# Patient Record
Sex: Male | Born: 1990 | Hispanic: No | Marital: Single | Smoking: Never smoker
Health system: Southern US, Community
[De-identification: ages and names within clinical notes are randomized; demographics above are authoritative.]

## PROBLEM LIST (undated history)

## (undated) HISTORY — PX: TONSILLECTOMY: SUR1361

---
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
## 2013-01-27 ENCOUNTER — Encounter (HOSPITAL_COMMUNITY): Payer: Self-pay | Admitting: Emergency Medicine

## 2013-01-27 ENCOUNTER — Emergency Department (INDEPENDENT_AMBULATORY_CARE_PROVIDER_SITE_OTHER)
Admission: EM | Admit: 2013-01-27 | Discharge: 2013-01-27 | Disposition: A | Discharge status: Home / Self Care | Payer: BC Managed Care – PPO | Source: Home / Self Care | Attending: Family Medicine | Admitting: Family Medicine

## 2013-01-27 DIAGNOSIS — L237 Allergic contact dermatitis due to plants, except food: Secondary | ICD-10-CM

## 2013-01-27 DIAGNOSIS — L255 Unspecified contact dermatitis due to plants, except food: Secondary | ICD-10-CM

## 2013-01-27 MED ORDER — METHYLPREDNISOLONE ACETATE 80 MG/ML IJ SUSP
INTRAMUSCULAR | Status: AC
  Filled 2013-01-27: qty 1

## 2013-01-27 MED ORDER — METHYLPREDNISOLONE ACETATE 40 MG/ML IJ SUSP
80.0000 mg | Freq: Once | INTRAMUSCULAR | Status: AC
  Administered 2013-01-27: 80 mg via INTRAMUSCULAR

## 2013-01-27 MED ORDER — FLUTICASONE PROPIONATE 0.05 % EX CREA: TOPICAL_CREAM | Freq: Two times a day (BID) | CUTANEOUS | Status: DC

## 2013-01-27 MED ORDER — TRIAMCINOLONE ACETONIDE 40 MG/ML IJ SUSP
40.0000 mg | Freq: Once | INTRAMUSCULAR | Status: AC
  Administered 2013-01-27: 40 mg via INTRAMUSCULAR

## 2013-01-27 MED ORDER — TRIAMCINOLONE ACETONIDE 40 MG/ML IJ SUSP
INTRAMUSCULAR | Status: AC
  Filled 2013-01-27: qty 5

## 2013-01-27 NOTE — ED Notes (Signed)
Pt c/o poss poison ivy onset Monday on right arm Denies: f/v/n/d... He is alert and oriented w/no signs of acute distress.

## 2013-01-27 NOTE — ED Provider Notes (Addendum)
History     CSN: [id number]  Arrival date & time 01/27/13  1412   None     Chief Complaint  Patient presents with  . Poison Ivy    (Consider location/radiation/quality/duration/timing/severity/associated sxs/prior treatment) Patient is a 22 y.o. male presenting with rash. The history is provided by the patient.  Rash Pain severity:  Mild Onset quality:  Gradual Duration:  6 days Progression:  Worsening Chronicity:  New Context comment:  Onset after poison ivy contact with dog.   History reviewed. No pertinent past medical history.  History reviewed. No pertinent past surgical history.  No family history on file.  History  Substance Use Topics  . Smoking status: Never Smoker   . Smokeless tobacco: Not on file  . Alcohol Use: Yes      Review of Systems  Constitutional: Negative.   Skin: Positive for rash.    Allergies  Review of patient's allergies indicates no known allergies.  Home Medications   Current Outpatient Rx  Name  Route  Sig  Dispense  Refill  . fluticasone (CUTIVATE) 0.05 % cream   Topical   Apply topically 2 (two) times daily.   60 g   0     BP 123/75  Pulse 71  Temp(Src) 97.9 F (36.6 C) (Oral)  Resp 17  SpO2 96%  Physical Exam  Nursing note and vitals reviewed. Constitutional: He is oriented to person, place, and time. He appears well-developed and well-nourished.  Neurological: He is alert and oriented to person, place, and time.  Skin: Skin is warm and dry. Rash noted.  Papulovesicular patchy rash primarily on upper ext.    ED Course  Procedures (including critical care time)  Labs Reviewed - No data to display No results found.   1. Contact dermatitis due to poison ivy       MDM          Linna Hoff, MD 01/27/13 1515  Linna Hoff, MD 01/28/13 [phone]

## 2013-10-02 ENCOUNTER — Encounter (HOSPITAL_COMMUNITY): Payer: Self-pay | Admitting: Emergency Medicine

## 2013-10-02 ENCOUNTER — Emergency Department (INDEPENDENT_AMBULATORY_CARE_PROVIDER_SITE_OTHER)
Admission: EM | Admit: 2013-10-02 | Discharge: 2013-10-02 | Disposition: A | Discharge status: Home / Self Care | Payer: BC Managed Care – PPO | Source: Home / Self Care

## 2013-10-02 DIAGNOSIS — J029 Acute pharyngitis, unspecified: Secondary | ICD-10-CM

## 2013-10-02 DIAGNOSIS — J069 Acute upper respiratory infection, unspecified: Secondary | ICD-10-CM

## 2013-10-02 DIAGNOSIS — R0982 Postnasal drip: Secondary | ICD-10-CM

## 2013-10-02 LAB — POCT RAPID STREP A: Streptococcus, Group A Screen (Direct): NEGATIVE

## 2013-10-02 MED ORDER — ALBUTEROL SULFATE HFA 108 (90 BASE) MCG/ACT IN AERS: 1.0000 | INHALATION_SPRAY | Freq: Four times a day (QID) | RESPIRATORY_TRACT | Status: DC | PRN

## 2013-10-02 MED ORDER — GUAIFENESIN-CODEINE 100-10 MG/5ML PO SYRP: 5.0000 mL | ORAL_SOLUTION | ORAL | Status: DC | PRN

## 2013-10-02 NOTE — Discharge Instructions (Signed)
Cough, Adult  A cough is a reflex that helps clear your throat and airways. It can help heal the body or may be a reaction to an irritated airway. A cough may only last 2 or 3 weeks (acute) or may last more than 8 weeks (chronic).  CAUSES Acute cough:  Viral or bacterial infections. Chronic cough:  Infections.  Allergies.  Asthma.  Post-nasal drip.  Smoking.  Heartburn or acid reflux.  Some medicines.  Chronic lung problems (COPD).  Cancer. SYMPTOMS   Cough.  Fever.  Chest pain.  Increased breathing rate.  High-pitched whistling sound when breathing (wheezing).  Colored mucus that you cough up (sputum). TREATMENT   A bacterial cough may be treated with antibiotic medicine.  A viral cough must run its course and will not respond to antibiotics.  Your caregiver may recommend other treatments if you have a chronic cough. HOME CARE INSTRUCTIONS   Only take over-the-counter or prescription medicines for pain, discomfort, or fever as directed by your caregiver. Use cough suppressants only as directed by your caregiver.  Use a cold steam vaporizer or humidifier in your bedroom or home to help loosen secretions.  Sleep in a semi-upright position if your cough is worse at night.  Rest as needed.  Stop smoking if you smoke. SEEK IMMEDIATE MEDICAL CARE IF:   You have pus in your sputum.  Your cough starts to worsen.  You cannot control your cough with suppressants and are losing sleep.  You begin coughing up blood.  You have difficulty breathing.  You develop pain which is getting worse or is uncontrolled with medicine.  You have a fever. MAKE SURE YOU:   Understand these instructions.  Will watch your condition.  Will get help right away if you are not doing well or get worse. Document Released: 02/12/2011 Document Revised: 11/08/2011 Document Reviewed: 02/12/2011 [facility] Patient Information 2014 [place].  Pharyngitis Pharyngitis is  redness, pain, and swelling (inflammation) of your pharynx.  CAUSES  Pharyngitis is usually caused by infection. Most of the time, these infections are from viruses (viral) and are part of a cold. However, sometimes pharyngitis is caused by bacteria (bacterial). Pharyngitis can also be caused by allergies. Viral pharyngitis may be spread from person to person by coughing, sneezing, and personal items or utensils (cups, forks, spoons, toothbrushes). Bacterial pharyngitis may be spread from person to person by more intimate contact, such as kissing.  SIGNS AND SYMPTOMS  Symptoms of pharyngitis include:   Sore throat.   Tiredness (fatigue).   Low-grade fever.   Headache.  Joint pain and muscle aches.  Skin rashes.  Swollen lymph nodes.  Plaque-like film on throat or tonsils (often seen with bacterial pharyngitis). DIAGNOSIS  Your health care provider will ask you questions about your illness and your symptoms. Your medical history, along with a physical exam, is often all that is needed to diagnose pharyngitis. Sometimes, a rapid strep test is done. Other lab tests may also be done, depending on the suspected cause.  TREATMENT  Viral pharyngitis will usually get better in 3 4 days without the use of medicine. Bacterial pharyngitis is treated with medicines that kill germs (antibiotics).  HOME CARE INSTRUCTIONS   Drink enough water and fluids to keep your urine clear or pale yellow.   Only take over-the-counter or prescription medicines as directed by your health care provider:   If you are prescribed antibiotics, make sure you finish them even if you start to feel better.   Do  not take aspirin.   Get lots of rest.   Gargle with 8 oz of salt water ( tsp of salt per 1 qt of water) as often as every 1 2 hours to soothe your throat.   Throat lozenges (if you are not at risk for choking) or sprays may be used to soothe your throat. SEEK MEDICAL CARE IF:   You have large,  tender lumps in your neck.  You have a rash.  You cough up green, yellow-brown, or bloody spit. SEEK IMMEDIATE MEDICAL CARE IF:   Your neck becomes stiff.  You drool or are unable to swallow liquids.  You vomit or are unable to keep medicines or liquids down.  You have severe pain that does not go away with the use of recommended medicines.  You have trouble breathing (not caused by a stuffy nose). MAKE SURE YOU:   Understand these instructions.  Will watch your condition.  Will get help right away if you are not doing well or get worse. Document Released: 08/16/2005 Document Revised: 06/06/2013 Document Reviewed: 04/23/2013 [facility] Patient Information 2014 [place], [place].  Sore Throat A sore throat is pain, burning, irritation, or scratchiness of the throat. There is often pain or tenderness when swallowing or talking. A sore throat may be accompanied by other symptoms, such as coughing, sneezing, fever, and swollen neck glands. A sore throat is often the first sign of another sickness, such as a cold, flu, strep throat, or mononucleosis (commonly known as mono). Most sore throats go away without medical treatment. CAUSES  The most common causes of a sore throat include:  A viral infection, such as a cold, flu, or mono.  A bacterial infection, such as strep throat, tonsillitis, or whooping cough.  Seasonal allergies.  Dryness in the air.  Irritants, such as smoke or pollution.  Gastroesophageal reflux disease (GERD). HOME CARE INSTRUCTIONS   Only take over-the-counter medicines as directed by your caregiver.  Drink enough fluids to keep your urine clear or pale yellow.  Rest as needed.  Try using throat sprays, lozenges, or sucking on hard candy to ease any pain (if older than 4 years or as directed).  Sip warm liquids, such as broth, herbal tea, or warm water with honey to relieve pain temporarily. You may also eat or drink cold or frozen liquids such as frozen  ice pops.  Gargle with salt water (mix 1 tsp salt with 8 oz of water).  Do not smoke and avoid secondhand smoke.  Put a cool-mist humidifier in your bedroom at night to moisten the air. You can also turn on a hot shower and sit in the bathroom with the door closed for 5 10 minutes. SEEK IMMEDIATE MEDICAL CARE IF:  You have difficulty breathing.  You are unable to swallow fluids, soft foods, or your saliva.  You have increased swelling in the throat.  Your sore throat does not get better in 7 days.  You have nausea and vomiting.  You have a fever or persistent symptoms for more than 2 3 days.  You have a fever and your symptoms suddenly get worse. MAKE SURE YOU:   Understand these instructions.  Will watch your condition.  Will get help right away if you are not doing well or get worse. Document Released: 09/23/2004 Document Revised: 08/02/2012 Document Reviewed: 04/23/2012 [facility] Patient Information 2014 [place], [place].  Upper Respiratory Infection, Adult Use alka seltzer cold plus nighttime medication An upper respiratory infection (URI) is also sometimes known as  the common cold. The upper respiratory tract includes the nose, sinuses, throat, trachea, and bronchi. Bronchi are the airways leading to the lungs. Most people improve within 1 week, but symptoms can last up to 2 weeks. A residual cough may last even longer.  CAUSES Many different viruses can infect the tissues lining the upper respiratory tract. The tissues become irritated and inflamed and often become very moist. Mucus production is also common. A cold is contagious. You can easily spread the virus to others by oral contact. This includes kissing, sharing a glass, coughing, or sneezing. Touching your mouth or nose and then touching a surface, which is then touched by another person, can also spread the virus. SYMPTOMS  Symptoms typically develop 1 to 3 days after you come in contact with a cold virus. Symptoms  vary from person to person. They may include:  Runny nose.  Sneezing.  Nasal congestion.  Sinus irritation.  Sore throat.  Loss of voice (laryngitis).  Cough.  Fatigue.  Muscle aches.  Loss of appetite.  Headache.  Low-grade fever. DIAGNOSIS  You might diagnose your own cold based on familiar symptoms, since most people get a cold 2 to 3 times a year. Your caregiver can confirm this based on your exam. Most importantly, your caregiver can check that your symptoms are not due to another disease such as strep throat, sinusitis, pneumonia, asthma, or epiglottitis. Blood tests, throat tests, and X-rays are not necessary to diagnose a common cold, but they may sometimes be helpful in excluding other more serious diseases. Your caregiver will decide if any further tests are required. RISKS AND COMPLICATIONS  You may be at risk for a more severe case of the common cold if you smoke cigarettes, have chronic heart disease (such as heart failure) or lung disease (such as asthma), or if you have a weakened immune system. The very young and very old are also at risk for more serious infections. Bacterial sinusitis, middle ear infections, and bacterial pneumonia can complicate the common cold. The common cold can worsen asthma and chronic obstructive pulmonary disease (COPD). Sometimes, these complications can require emergency medical care and may be life-threatening. PREVENTION  The best way to protect against getting a cold is to practice good hygiene. Avoid oral or hand contact with people with cold symptoms. Wash your hands often if contact occurs. There is no clear evidence that vitamin C, vitamin E, echinacea, or exercise reduces the chance of developing a cold. However, it is always recommended to get plenty of rest and practice good nutrition. TREATMENT  Treatment is directed at relieving symptoms. There is no cure. Antibiotics are not effective, because the infection is caused by a  virus, not by bacteria. Treatment may include:  Increased fluid intake. Sports drinks offer valuable electrolytes, sugars, and fluids.  Breathing heated mist or steam (vaporizer or shower).  Eating chicken soup or other clear broths, and maintaining good nutrition.  Getting plenty of rest.  Using gargles or lozenges for comfort.  Controlling fevers with ibuprofen or acetaminophen as directed by your caregiver.  Increasing usage of your inhaler if you have asthma. Zinc gel and zinc lozenges, taken in the first 24 hours of the common cold, can shorten the duration and lessen the severity of symptoms. Pain medicines may help with fever, muscle aches, and throat pain. A variety of non-prescription medicines are available to treat congestion and runny nose. Your caregiver can make recommendations and may suggest nasal or lung inhalers for other symptoms.  HOME CARE INSTRUCTIONS   Only take over-the-counter or prescription medicines for pain, discomfort, or fever as directed by your caregiver.  Use a warm mist humidifier or inhale steam from a shower to increase air moisture. This may keep secretions moist and make it easier to breathe.  Drink enough water and fluids to keep your urine clear or pale yellow.  Rest as needed.  Return to work when your temperature has returned to normal or as your caregiver advises. You may need to stay home longer to avoid infecting others. You can also use a face mask and careful hand washing to prevent spread of the virus. SEEK MEDICAL CARE IF:   After the first few days, you feel you are getting worse rather than better.  You need your caregiver's advice about medicines to control symptoms.  You develop chills, worsening shortness of breath, or brown or red sputum. These may be signs of pneumonia.  You develop yellow or brown nasal discharge or pain in the face, especially when you bend forward. These may be signs of sinusitis.  You develop a fever,  swollen neck glands, pain with swallowing, or white areas in the back of your throat. These may be signs of strep throat. SEEK IMMEDIATE MEDICAL CARE IF:   You have a fever.  You develop severe or persistent headache, ear pain, sinus pain, or chest pain.  You develop wheezing, a prolonged cough, cough up blood, or have a change in your usual mucus (if you have chronic lung disease).  You develop sore muscles or a stiff neck. Document Released: 02/09/2001 Document Revised: 11/08/2011 Document Reviewed: 12/18/2010 [facility] Patient Information 2014 [place], [place].

## 2013-10-02 NOTE — ED Notes (Signed)
C/o  Sore throat.  Cough and congestion.  Bilateral ear pain.  Chills.  Symptoms started on Tuesday with fever.  States several vomiting episodes on Sunday from coughing.  Pt has been using thera flu and mucinex with no relief.

## 2013-10-02 NOTE — ED Provider Notes (Signed)
CSN: [id number]     Arrival date & time 10/02/13  1032 History   First MD Initiated Contact with Patient 10/02/13 1055     Chief Complaint  Patient presents with  . URI  . Sore Throat   (Consider location/radiation/quality/duration/timing/severity/associated sxs/prior Treatment) HPI Comments: 23 year old male complaining of cough and cold symptoms for approximately one week. He has taken TheraFlu and Mucinex but stopped his medicines yesterday because they were not helping. He denies any documented fever but felt cold last week and was wearing 10 layers of clothing. There is no history of asthma or tobacco use.  Patient is a 23 y.o. male presenting with URI and pharyngitis.  URI Presenting symptoms: cough, ear pain, rhinorrhea and sore throat   Presenting symptoms: no fatigue and no fever   Associated symptoms: no neck pain and no wheezing   Sore Throat Pertinent negatives include no shortness of breath.    History reviewed. No pertinent past medical history. History reviewed. No pertinent past surgical history. History reviewed. No pertinent family history. History  Substance Use Topics  . Smoking status: Never Smoker   . Smokeless tobacco: Not on file  . Alcohol Use: Yes    Review of Systems  Constitutional: Positive for activity change. Negative for fever, diaphoresis and fatigue.  HENT: Positive for ear pain, postnasal drip, rhinorrhea and sore throat. Negative for facial swelling and trouble swallowing.        Painful swallowing  Eyes: Negative for pain, discharge and redness.  Respiratory: Positive for cough. Negative for chest tightness, shortness of breath and wheezing.   Cardiovascular: Negative.   Gastrointestinal:       Occasional posttussive vomiting.  Musculoskeletal: Negative.  Negative for neck pain and neck stiffness.  Neurological: Negative.     Allergies  Review of patient's allergies indicates no known allergies.  Home Medications   Current Outpatient  Rx  Name  Route  Sig  Dispense  Refill  . ibuprofen (ADVIL,MOTRIN) 100 MG/5ML suspension   Oral   Take 200 mg by mouth every 4 (four) hours as needed.         Marland Kitchen. albuterol (PROVENTIL HFA;VENTOLIN HFA) 108 (90 BASE) MCG/ACT inhaler   Inhalation   Inhale 1-2 puffs into the lungs every 6 (six) hours as needed for wheezing or shortness of breath.   1 Inhaler   0   . fluticasone (CUTIVATE) 0.05 % cream   Topical   Apply topically 2 (two) times daily.   60 g   0   . guaiFENesin-codeine (CHERATUSSIN AC) 100-10 MG/5ML syrup   Oral   Take 5 mLs by mouth every 4 (four) hours as needed for cough or congestion.   120 mL   0    BP 128/79  Pulse 84  Temp(Src) 98.3 F (36.8 C) (Oral)  Resp 20  SpO2 98% Physical Exam  Nursing note and vitals reviewed. Constitutional: He is oriented to person, place, and time. He appears well-developed and well-nourished. No distress.  HENT:  Mouth/Throat: No oropharyngeal exudate.  Bilateral TMs are retracted right greater than left. Oropharynx erythematous but without exudate or swelling. Positive for clear PND  Eyes: Conjunctivae and EOM are normal.  Neck: Normal range of motion. Neck supple.  Cardiovascular: Normal rate, regular rhythm and normal heart sounds.   Pulmonary/Chest: Effort normal and breath sounds normal. No respiratory distress. He has no rales.  Rare distant faint wheeze on a couple of forced expirations.  Musculoskeletal: Normal range of motion. He exhibits no  edema.  Lymphadenopathy:    He has no cervical adenopathy.  Neurological: He is alert and oriented to person, place, and time.  Skin: Skin is warm and dry. No rash noted.  Psychiatric: He has a normal mood and affect.    ED Course  Procedures (including critical care time) Labs Review Labs Reviewed  POCT RAPID STREP A ([facility] URG CARE ONLY)   Imaging Review No results found. Results for orders placed during the hospital encounter of 10/02/13  POCT RAPID STREP A ([facility]  URG CARE ONLY)      Result Value Range   Streptococcus, Group A Screen (Direct) NEGATIVE  NEGATIVE      MDM   1. URI (upper respiratory infection)   2. Pharyngitis   3. PND (post-nasal drip)    Alka-Seltzer cold plus nighttime relief medication Cheratussin a.c. for cough Albuterol a chest x-ray 2 puffs every 4 hours when necessary cough and cough spasms Tylenol every 4 hours as needed Drink plenty of fluids stay well hydrated 4 high fevers, cough and shortness of breath sick medical attention promptly.    Hayden Rasmussen, NP 10/02/13 1141

## 2013-10-04 LAB — CULTURE, GROUP A STREP

## 2013-10-05 NOTE — ED Provider Notes (Signed)
Medical screening examination/treatment/procedure(s) were performed by resident physician or non-physician practitioner and as supervising physician I was immediately available for consultation/collaboration.   Aarushi Hemric DOUGLAS MD.   Naiah Donahoe D Kayline Sheer, MD 10/05/13 1100 

## 2014-11-08 ENCOUNTER — Emergency Department (INDEPENDENT_AMBULATORY_CARE_PROVIDER_SITE_OTHER)
Admission: EM | Admit: 2014-11-08 | Discharge: 2014-11-08 | Disposition: A | Discharge status: Home / Self Care | Payer: Self-pay | Source: Home / Self Care | Attending: Emergency Medicine | Admitting: Emergency Medicine

## 2014-11-08 ENCOUNTER — Encounter (HOSPITAL_COMMUNITY): Payer: Self-pay | Admitting: Emergency Medicine

## 2014-11-08 DIAGNOSIS — L237 Allergic contact dermatitis due to plants, except food: Secondary | ICD-10-CM

## 2014-11-08 MED ORDER — PREDNISONE 10 MG PO TABS: ORAL_TABLET | ORAL | Status: DC

## 2014-11-08 MED ORDER — METHYLPREDNISOLONE SODIUM SUCC 125 MG IJ SOLR
INTRAMUSCULAR | Status: AC
  Filled 2014-11-08: qty 2

## 2014-11-08 MED ORDER — METHYLPREDNISOLONE SODIUM SUCC 125 MG IJ SOLR
125.0000 mg | Freq: Once | INTRAMUSCULAR | Status: AC
  Administered 2014-11-08: 125 mg via INTRAMUSCULAR

## 2014-11-08 NOTE — Discharge Instructions (Signed)

## 2014-11-08 NOTE — ED Provider Notes (Addendum)
CSN: [id number]     Arrival date & time 11/08/14  1105 History   First MD Initiated Contact with Patient 11/08/14 1202     Chief Complaint  Patient presents with  . Poison Ivy   (Consider location/radiation/quality/duration/timing/severity/associated sxs/prior Treatment) Patient is a 24 y.o. male presenting with rash. The history is provided by the patient. No language interpreter was used.  Rash Location:  Full body Quality: blistering   Severity:  Moderate Onset quality:  Gradual Timing:  Constant Progression:  Worsening Chronicity:  New Context: insect bite/sting   Relieved by:  Nothing Worsened by:  Nothing tried Ineffective treatments:  None tried   History reviewed. No pertinent past medical history. History reviewed. No pertinent past surgical history. No family history on file. History  Substance Use Topics  . Smoking status: Never Smoker   . Smokeless tobacco: Not on file  . Alcohol Use: Yes    Review of Systems  Skin: Positive for rash.  All other systems reviewed and are negative.   Allergies  Review of patient's allergies indicates no known allergies.  Home Medications   Prior to Admission medications   Medication Sig Start Date End Date Taking? Authorizing Provider  albuterol (PROVENTIL HFA;VENTOLIN HFA) 108 (90 BASE) MCG/ACT inhaler Inhale 1-2 puffs into the lungs every 6 (six) hours as needed for wheezing or shortness of breath. 10/02/13   Hayden Rasmussenavid Mabe, NP  fluticasone (CUTIVATE) 0.05 % cream Apply topically 2 (two) times daily. 01/27/13   Linna HoffJames D Kindl, MD  guaiFENesin-codeine (CHERATUSSIN AC) 100-10 MG/5ML syrup Take 5 mLs by mouth every 4 (four) hours as needed for cough or congestion. 10/02/13   Hayden Rasmussenavid Mabe, NP  ibuprofen (ADVIL,MOTRIN) 100 MG/5ML suspension Take 200 mg by mouth every 4 (four) hours as needed.    Historical Provider, MD   BP 122/66 mmHg  Pulse 55  Temp(Src) 97.8 F (36.6 C) (Oral)  Resp 18  SpO2 100% Physical Exam  Constitutional:  He is oriented to person, place, and time. He appears well-developed and well-nourished.  HENT:  Head: Normocephalic and atraumatic.  Eyes: Conjunctivae and EOM are normal. Pupils are equal, round, and reactive to light.  Neck: Normal range of motion.  Cardiovascular: Normal heart sounds.   Pulmonary/Chest: Effort normal.  Musculoskeletal: Normal range of motion.  Neurological: He is alert and oriented to person, place, and time.  Skin: Rash noted. There is erythema.  Erythemataous, linear streaks   Psychiatric: He has a normal mood and affect.  Nursing note and vitals reviewed.   ED Course  Procedures (including critical care time) Labs Review Labs Reviewed - No data to display  Imaging Review No results found.   MDM   1. Poison ivy    Prednisone taper x 12 days  Meds ordered this encounter  Medications  . methylPREDNISolone sodium succinate (SOLU-MEDROL) 125 mg/2 mL injection 125 mg    Sig:   . predniSONE (DELTASONE) 10 MG tablet    Sig: 6,6,5,5,4,4,3,3,2,2,1,1 taper    Dispense:  42 tablet    Refill:  0    Order Specific Question:  Supervising Provider    Answer:  Bradd CanaryKINDL, JAMES D [[id number]]    Elson AreasLeslie K Sofia, PA-C 11/08/14 1221  Lonia SkinnerLeslie K ButteSofia, PA-C 11/08/14 1224  Lonia SkinnerLeslie K SomersetSofia, [place] JerseyPA-C 11/12/14 1757

## 2014-11-08 NOTE — ED Notes (Signed)
C/o poss poison ivy onset Monday States he was fishing and came in contact w/poison ivy Denies dyspnea Alert, no signs of acute distress.

## 2015-11-01 ENCOUNTER — Other Ambulatory Visit (HOSPITAL_COMMUNITY)
Admission: RE | Admit: 2015-11-01 | Discharge: 2015-11-01 | Disposition: A | Discharge status: Home / Self Care | Payer: Self-pay | Source: Ambulatory Visit | Attending: Emergency Medicine | Admitting: Emergency Medicine

## 2015-11-01 ENCOUNTER — Encounter (HOSPITAL_COMMUNITY): Payer: Self-pay | Admitting: Emergency Medicine

## 2015-11-01 ENCOUNTER — Emergency Department (INDEPENDENT_AMBULATORY_CARE_PROVIDER_SITE_OTHER)
Admission: EM | Admit: 2015-11-01 | Discharge: 2015-11-01 | Disposition: A | Discharge status: Home / Self Care | Payer: Self-pay | Source: Home / Self Care | Attending: Emergency Medicine | Admitting: Emergency Medicine

## 2015-11-01 DIAGNOSIS — H109 Unspecified conjunctivitis: Secondary | ICD-10-CM

## 2015-11-01 DIAGNOSIS — J029 Acute pharyngitis, unspecified: Secondary | ICD-10-CM

## 2015-11-01 LAB — POCT RAPID STREP A: STREPTOCOCCUS, GROUP A SCREEN (DIRECT): NEGATIVE

## 2015-11-01 MED ORDER — POLYMYXIN B-TRIMETHOPRIM 10000-0.1 UNIT/ML-% OP SOLN: 1.0000 [drp] | Freq: Four times a day (QID) | OPHTHALMIC | Status: DC

## 2015-11-01 NOTE — ED Notes (Addendum)
Reports left eye draining pus, swollen and irritated, and blurry vision.  Patient had a sore throat for 4 days, denies runny nose.  Patient took medicine around 8-8:30 am  No fever currently

## 2015-11-01 NOTE — Discharge Instructions (Signed)
Your strep test is negative. You can do salt water gargles, tea with honey, Chloraseptic spray, or Cepacol lozenges to help with the sore throat and cough. Use the eyedrops 4 times a day for your left eye. If your eye is not getting better in the next 2-3 days, please follow-up with Dr. Sherryll BurgerShah, an ophthalmologist.

## 2015-11-01 NOTE — ED Provider Notes (Signed)
CSN: [id number]     Arrival date & time 11/01/15  1409 History   First MD Initiated Contact with Patient 11/01/15 1615     Chief Complaint  Patient presents with  . Eye Problem   (Consider location/radiation/quality/duration/timing/severity/associated sxs/prior Treatment) HPI He is a 25 year old man here for evaluation of left eye redness. He states this started last night with redness and irritation. He reports a lot of pus coming from the eye last night. This morning, it was crusted shut in the eyelid was swollen. He continues to have a burning irritation of the eye. He states it feels like there is pepper his eye. He denies any foreign body sensation or sensitivity to light. He does report blurred vision.  No injury or trauma. He has had 4 days of sore throat and cough. Denies nasal congestion or runny nose. No fevers or chills. No nausea or vomiting.  History reviewed. No pertinent past medical history. History reviewed. No pertinent past surgical history. No family history on file. Social History  Substance Use Topics  . Smoking status: Never Smoker   . Smokeless tobacco: None  . Alcohol Use: Yes    Review of Systems As in history of present illness Allergies  Review of patient's allergies indicates no known allergies.  Home Medications   Prior to Admission medications   Medication Sig Start Date End Date Taking? Authorizing Provider  Chlorphen-Pseudoephed-APAP [facility](THERAFLU FLU/COLD PO) Take by mouth.   Yes Historical Provider, MD  albuterol (PROVENTIL HFA;VENTOLIN HFA) 108 (90 BASE) MCG/ACT inhaler Inhale 1-2 puffs into the lungs every 6 (six) hours as needed for wheezing or shortness of breath. 10/02/13   Hayden Rasmussenavid Mabe, NP  fluticasone (CUTIVATE) 0.05 % cream Apply topically 2 (two) times daily. 01/27/13   Linna HoffJames D Kindl, MD  guaiFENesin-codeine (CHERATUSSIN AC) 100-10 MG/5ML syrup Take 5 mLs by mouth every 4 (four) hours as needed for cough or congestion. 10/02/13   Hayden Rasmussenavid Mabe, NP   ibuprofen (ADVIL,MOTRIN) 100 MG/5ML suspension Take 200 mg by mouth every 4 (four) hours as needed.    Historical Provider, MD  trimethoprim-polymyxin b (POLYTRIM) ophthalmic solution Place 1 drop into the left eye 4 (four) times daily. For 5 days 11/01/15   Charm RingsErin J Jaylyn Booher, MD   Meds Ordered and Administered this Visit  Medications - No data to display  BP 126/78 mmHg  Pulse 71  Temp(Src) 98.1 F (36.7 C) (Oral)  SpO2 97% No data found.   Physical Exam  Constitutional: He is oriented to person, place, and time. He appears well-developed and well-nourished. No distress.  HENT:  Mouth/Throat: No oropharyngeal exudate.  Oropharynx is erythematous. Surgically absent tonsils.  Eyes: EOM are normal. Pupils are equal, round, and reactive to light. Left eye exhibits discharge.  Left conjunctivitis quite injected. No foreign body.  Neck: Neck supple.  Cardiovascular: Normal rate, regular rhythm and normal heart sounds.   No murmur heard. Pulmonary/Chest: Effort normal and breath sounds normal. No respiratory distress. He has no wheezes. He has no rales.  Lymphadenopathy:    He has no cervical adenopathy.  Neurological: He is alert and oriented to person, place, and time.    ED Course  Procedures (including critical care time)  Labs Review Labs Reviewed  POCT RAPID STREP A    Imaging Review No results found.   Visual Acuity Review  Right Eye Distance: 20/25 Left Eye Distance: 20/70 Bilateral Distance: 20/25  Right Eye Near: R Near: 20/25 Left Eye Near:  L Near: 20/70 Bilateral  Near:  20/25      MDM   1. Viral pharyngitis   2. Conjunctivitis of left eye    Rapid strep is negative. Symptomatic treatment for sore throat and cough. Polytrim drops for the left eye. If the eye is not improving in the next 2-3 days, he should follow-up with ophthalmology.  Charm Rings, MD 11/01/15 [phone]

## 2015-11-04 LAB — CULTURE, GROUP A STREP (THRC)

## 2016-04-19 ENCOUNTER — Encounter (HOSPITAL_COMMUNITY): Payer: Self-pay | Admitting: Emergency Medicine

## 2016-04-19 DIAGNOSIS — R1110 Vomiting, unspecified: Secondary | ICD-10-CM | POA: Insufficient documentation

## 2016-04-19 DIAGNOSIS — Z5321 Procedure and treatment not carried out due to patient leaving prior to being seen by health care provider: Secondary | ICD-10-CM | POA: Insufficient documentation

## 2016-04-19 LAB — COMPREHENSIVE METABOLIC PANEL
ALT: 44 U/L (ref 17–63)
AST: 30 U/L (ref 15–41)
Albumin: 3.8 g/dL (ref 3.5–5.0)
Alkaline Phosphatase: 57 U/L (ref 38–126)
Anion gap: 5 (ref 5–15)
BUN: 8 mg/dL (ref 6–20)
CO2: 30 mmol/L (ref 22–32)
CREATININE: 0.96 mg/dL (ref 0.61–1.24)
Calcium: 8.4 mg/dL — ABNORMAL LOW (ref 8.9–10.3)
Chloride: 99 mmol/L — ABNORMAL LOW (ref 101–111)
GFR calc Af Amer: >60 mL/min (ref >60)
GLUCOSE: 93 mg/dL (ref 65–99)
Potassium: 3.5 mmol/L (ref 3.5–5.1)
Sodium: 134 mmol/L — ABNORMAL LOW (ref 135–145)
Total Bilirubin: 0.4 mg/dL (ref 0.3–1.2)
Total Protein: 7 g/dL (ref 6.5–8.1)

## 2016-04-19 LAB — CBC WITH DIFFERENTIAL/PLATELET
Basophils Absolute: 0 10*3/uL (ref 0.0–0.1)
Basophils Relative: 0 %
EOS PCT: 1 %
Eosinophils Absolute: 0.1 10*3/uL (ref 0.0–0.7)
HCT: 49.2 % (ref 39.0–52.0)
Hemoglobin: 17.2 g/dL — ABNORMAL HIGH (ref 13.0–17.0)
Lymphocytes Relative: 11 %
Lymphs Abs: 1.3 10*3/uL (ref 0.7–4.0)
MCH: 30.1 pg (ref 26.0–34.0)
MCHC: 35 g/dL (ref 30.0–36.0)
MCV: 86.2 fL (ref 78.0–100.0)
Monocytes Absolute: 1.1 10*3/uL — ABNORMAL HIGH (ref 0.1–1.0)
Monocytes Relative: 10 %
NEUTROS ABS: 8.7 10*3/uL — AB (ref 1.7–7.7)
Neutrophils Relative %: 78 %
PLATELETS: 292 10*3/uL (ref 150–400)
RBC: 5.71 MIL/uL (ref 4.22–5.81)
RDW: 13.5 % (ref 11.5–15.5)
WBC: 11.2 10*3/uL — AB (ref 4.0–10.5)

## 2016-04-19 NOTE — ED Notes (Signed)
Pt walked up to the desk, stating that he was leaving due to wait, Pt encouraged to stay. Pt was seen walking out the lobby.

## 2016-04-19 NOTE — ED Triage Notes (Signed)
Pt st's he has had nausea, vomiting and diarrhea x's 3 days.  St's unable to keep anything down.  St's he is starting to feel weak

## 2016-04-20 ENCOUNTER — Emergency Department (HOSPITAL_COMMUNITY)
Admission: EM | Admit: 2016-04-20 | Discharge: 2016-04-20 | Disposition: A | Discharge status: Home / Self Care | Payer: Self-pay | Attending: Emergency Medicine | Admitting: Emergency Medicine

## 2017-09-18 ENCOUNTER — Emergency Department (HOSPITAL_COMMUNITY)
Admission: EM | Admit: 2017-09-18 | Discharge: 2017-09-18 | Disposition: A | Discharge status: Home / Self Care | Payer: Self-pay | Attending: Emergency Medicine | Admitting: Emergency Medicine

## 2017-09-18 ENCOUNTER — Emergency Department (HOSPITAL_COMMUNITY): Payer: Self-pay

## 2017-09-18 ENCOUNTER — Encounter (HOSPITAL_COMMUNITY): Payer: Self-pay | Admitting: Emergency Medicine

## 2017-09-18 ENCOUNTER — Other Ambulatory Visit: Payer: Self-pay

## 2017-09-18 DIAGNOSIS — M25571 Pain in right ankle and joints of right foot: Secondary | ICD-10-CM | POA: Insufficient documentation

## 2017-09-18 MED ORDER — IBUPROFEN 200 MG PO TABS
600.0000 mg | ORAL_TABLET | Freq: Once | ORAL | Status: AC
Start: 2017-09-18 — End: 2017-09-18
  Administered 2017-09-18: 600 mg via ORAL
  Filled 2017-09-18: qty 1

## 2017-09-18 MED ORDER — IBUPROFEN 600 MG PO TABS: 600.0000 mg | ORAL_TABLET | Freq: Four times a day (QID) | ORAL | 0 refills | Status: DC | PRN

## 2017-09-18 MED ORDER — HYDROCODONE-ACETAMINOPHEN 5-325 MG PO TABS: 1.0000 | ORAL_TABLET | ORAL | 0 refills | Status: DC | PRN

## 2017-09-18 MED ORDER — HYDROCODONE-ACETAMINOPHEN 5-325 MG PO TABS
2.0000 | ORAL_TABLET | Freq: Once | ORAL | Status: AC
  Administered 2017-09-18: 2 via ORAL
  Filled 2017-09-18: qty 2

## 2017-09-18 NOTE — Discharge Instructions (Signed)
You have been seen today for a ankle injury. There were no acute abnormalities on the x-rays, including no sign of fracture or dislocation, however, there could be injuries to the soft tissues, such as the ligaments or tendons that are not seen on xrays. There could also be what are called occult fractures that are small fractures not seen on xray. Pain: Take 600 mg of ibuprofen every 6 hours or 440 mg (over the counter dose) to 500 mg (prescription dose) of naproxen every 12 hours for the next 3 days. After this time, these medications may be used as needed for pain. Take these medications with food to avoid upset stomach. Choose only one of these medications, do not take them together.  Tylenol: Should you continue to have additional pain while taking the ibuprofen or naproxen, you may add in tylenol as needed. Your daily total maximum amount of tylenol from all sources should be limited to 4000mg /day for persons without liver problems, or 2000mg /day for those with liver problems. Vicodin: May take Vicodin as needed for severe pain.  Do not drive or perform other dangerous activities while taking the Vicodin.  Please note that each pill of Vicodin contains 325 mg of Tylenol and the above dosage limits apply. Ice: May apply ice to the area over the next 24 hours for 15 minutes at a time to reduce swelling. Elevation: Keep the extremity elevated as often as possible to reduce pain and inflammation. Support: Wear the cam boot for support and comfort. Wear this until pain resolves. You will be weight-bearing as tolerated, which means you can slowly start to put weight on the extremity and increase amount and frequency as pain allows. Exercises: Start by performing these exercises a few times a week, increasing the frequency until you are performing them twice daily.  Follow up: Follow-up with the orthopedic specialist as soon as possible on this matter.

## 2017-09-18 NOTE — ED Provider Notes (Signed)
[facility] EMERGENCY DEPARTMENT Provider Note   CSN: [id number] Arrival date & time: 09/18/17  2058     History   Chief Complaint Chief Complaint  Patient presents with  . Ankle Pain    HPI Gregory [name] is a 27 y.o. male.  HPI   Gregory [name] is a 27 y.o. male, patient with no pertinent past medical history, presenting to the ED with right ankle injury that occurred this morning.  Patient states he was walking at work when he began to suddenly feel pain in the back of his ankle.  This was accompanied by stiffness in the ankle.  There was no "pop" felt or heard by the patient.  Pain is sharp, 10/10, radiating proximally into the calf.  Patient endorses pain with ambulation.  Denies numbness, falls, other injuries or any other complaints.     History reviewed. No pertinent past medical history.  There are no active problems to display for this patient.   Past Surgical History:  Procedure Laterality Date  . TONSILLECTOMY         Home Medications    Prior to Admission medications   Medication Sig Start Date End Date Taking? Authorizing Provider  albuterol (PROVENTIL HFA;VENTOLIN HFA) 108 (90 BASE) MCG/ACT inhaler Inhale 1-2 puffs into the lungs every 6 (six) hours as needed for wheezing or shortness of breath. 10/02/13   Hayden Rasmussen, NP  Chlorphen-Pseudoephed-APAP (THERAFLU FLU/COLD PO) Take by mouth.    [provider]  fluticasone (CUTIVATE) 0.05 % cream Apply topically 2 (two) times daily. 01/27/13   Linna Hoff, MD  guaiFENesin-codeine (CHERATUSSIN AC) 100-10 MG/5ML syrup Take 5 mLs by mouth every 4 (four) hours as needed for cough or congestion. 10/02/13   Hayden Rasmussen, NP  HYDROcodone-acetaminophen (NORCO/VICODIN) 5-325 MG tablet Take 1-2 tablets by mouth every 4 (four) hours as needed for severe pain. 09/18/17   Tahra Hitzeman C, PA-C  ibuprofen (ADVIL,MOTRIN) 600 MG tablet Take 1 tablet (600 mg total) by mouth every 6 (six) hours as  needed. 09/18/17   Jarae Panas C, PA-C  trimethoprim-polymyxin b (POLYTRIM) ophthalmic solution Place 1 drop into the left eye 4 (four) times daily. For 5 days 11/01/15   Charm Rings, MD    Family History No family history on file.  Social History Social History   Tobacco Use  . Smoking status: Never Smoker  . Smokeless tobacco: Never Used  Substance Use Topics  . Alcohol use: No  . Drug use: No     Allergies   Patient has no known allergies.   Review of Systems Review of Systems  Musculoskeletal: Positive for arthralgias.  Neurological: Negative for weakness and numbness.     Physical Exam Updated Vital Signs BP (!) 149/83 (BP Location: Left Arm)   Pulse 82   Temp 98.8 F (37.1 C) (Oral)   Resp 16   Ht 5\' 11"  (1.803 m)   Wt 87.1 kg (192 lb)   SpO2 97%   BMI 26.78 kg/m   Physical Exam  Constitutional: He appears well-developed and well-nourished. No distress.  HENT:  Head: Normocephalic and atraumatic.  Eyes: Conjunctivae are normal.  Neck: Neck supple.  Cardiovascular: Normal rate, regular rhythm and intact distal pulses.  Pulmonary/Chest: Effort normal.  Musculoskeletal: He exhibits tenderness. He exhibits no deformity.       Feet:  Dorsiflexion and plantar flexion of the right ankle are intact.  Thompson test appears to be normal. Increased pain to the  posterior calcaneus and Achilles with dorsiflexion.  No noted plantar flexion weakness. Palpation of the Achilles tendon appears to be continuous without noted gaps.  Neurological: He is alert.  No noted sensory deficits in the right lower extremity. Plantar flexion and dorsiflexion of the right ankle both 4/5 strength, suspected to be due to pain.  Skin: Skin is warm and dry. Capillary refill takes less than 2 seconds. He is not diaphoretic. No pallor.  Psychiatric: He has a normal mood and affect. His behavior is normal.  Nursing note and vitals reviewed.    ED Treatments / Results  Labs (all labs  ordered are listed, but only abnormal results are displayed) Labs Reviewed - No data to display  EKG  EKG Interpretation None       Radiology Dg Ankle Complete Right  Result Date: 09/18/2017 CLINICAL DATA:  Heel pain.  No trauma. EXAM: RIGHT ANKLE - COMPLETE 3+ VIEW COMPARISON:  None. FINDINGS: No fracture or dislocation. The ankle mortise is intact. The calcaneus demonstrates a normal shape with no evidence of fracture. An enthesophyte is seen at the Achilles insertion site posteriorly. There may be mild soft tissue swelling over this region based on the lateral view. Recommend clinical correlation. No other acute abnormalities. IMPRESSION: An irregular enthesophyte is seen at the posterosuperior aspect of the calcaneus. There may be some mild overlying soft tissue swelling. Recommend clinical correlation. No other abnormality. Electronically Signed   By: Gerome Samavid  Williams III M.D   On: 09/18/2017 21:37    Procedures Procedures (including critical care time)  Medications Ordered in ED Medications  HYDROcodone-acetaminophen (NORCO/VICODIN) 5-325 MG per tablet 2 tablet (not administered)  ibuprofen (ADVIL,MOTRIN) tablet 600 mg (not administered)     Initial Impression / Assessment and Plan / ED Course  I have reviewed the triage vital signs and the nursing notes.  Pertinent labs & imaging results that were available during my care of the patient were reviewed by me and considered in my medical decision making (see chart for details).      Patient presents with right posterior ankle pain.  Patient had sudden onset of pain.  No fracture or dislocation noted on x-ray.  Patient appears to be neurovascularly intact distal to the injury.  Achilles tendon appears to be grossly intact.  Recommend orthopedic follow-up as soon as possible. The patient was given instructions for home care as well as return precautions. Patient voices understanding of these instructions, accepts the plan, and is  comfortable with discharge.    Final Clinical Impressions(s) / ED Diagnoses   Final diagnoses:  Acute right ankle pain    ED Discharge Orders        Ordered    ibuprofen (ADVIL,MOTRIN) 600 MG tablet  Every 6 hours PRN     09/18/17 2324    HYDROcodone-acetaminophen (NORCO/VICODIN) 5-325 MG tablet  Every 4 hours PRN     09/18/17 2324       Anselm PancoastJoy, Estephanie Hubbs C, PA-C 09/18/17 2325    Eber HongMiller, Brian, MD 09/20/17 [phone]

## 2017-09-18 NOTE — ED Triage Notes (Signed)
Reports "rolling" right ankle at work yesterday.  C/o pain in ankle going up the back of ankle into the leg.  Reports difficulty bearing weight.

## 2017-09-22 ENCOUNTER — Encounter (INDEPENDENT_AMBULATORY_CARE_PROVIDER_SITE_OTHER): Payer: Self-pay | Admitting: Orthopedic Surgery

## 2017-09-22 ENCOUNTER — Ambulatory Visit (INDEPENDENT_AMBULATORY_CARE_PROVIDER_SITE_OTHER): Payer: Self-pay | Admitting: Orthopedic Surgery

## 2017-09-22 VITALS — Ht 71.0 in | Wt 192.0 lb

## 2017-09-22 DIAGNOSIS — S86011A Strain of right Achilles tendon, initial encounter: Secondary | ICD-10-CM

## 2017-09-22 MED ORDER — HYDROCODONE-ACETAMINOPHEN 5-325 MG PO TABS: 1.0000 | ORAL_TABLET | ORAL | 0 refills | Status: DC | PRN

## 2017-09-22 NOTE — Progress Notes (Signed)
   Office Visit Note   Patient: Caren GriffinsSalvatore V Barletta           Date of Birth: 08/08/1991           MRN: [id number] Visit Date: 09/22/2017              Requested by: No referring provider defined for this encounter. PCP: Patient, No Pcp Per  Chief Complaint  Patient presents with  . Right Foot - Pain    09/17/17 right achilles possible rupture.       HPI: Patient is a 27 year old gentleman who was at work Saturday night he states his foot gave out and had acute onset of pain along the Achilles tendon.  He went to urgent care on Sunday radiographs were obtained he was diagnosed with a fracture he was placed in a fracture boot with crutches prescription for Vicodin for pain and he is seen today for initial evaluation.  Assessment & Plan: Visit Diagnoses:  1. Achilles rupture, right, initial encounter     Plan: Patient has a partial disruption of the Achilles tendon at its insertion.  We will place him into 916 since heel lifts fracture boot nonweightbearing prescription for Vicodin follow-up in 2 weeks he was instructed not to return to work.  Follow-Up Instructions: Return in about 2 weeks (around 10/06/2017).   Ortho Exam  Patient is alert, oriented, no adenopathy, well-dressed, normal affect, normal respiratory effort. Examination patient has a good dorsalis pedis and posterior tibial pulse.  There is no palpable defect of the Achilles but it is very tender to palpation.  With the patient's kneeling compression of the calf reproduces good plantar flexion of both feet which is equal bilaterally.  Review of the radiographs shows a avulsion fracture of a calcaneal spur at the insertion of the Achilles.  Imaging: No results found. No images are attached to the encounter.  Labs: Lab Results  Component Value Date   REPTSTATUS 11/04/2015 FINAL 11/01/2015   CULT NO GROUP A STREP (S.PYOGENES) ISOLATED 11/01/2015    @LABSALLVALUES (HGBA1)@  Body mass index is 26.78 kg/m.  Orders:    No orders of the defined types were placed in this encounter.  No orders of the defined types were placed in this encounter.    Procedures: No procedures performed  Clinical Data: No additional findings.  ROS:  All other systems negative, except as noted in the HPI. Review of Systems  Objective: Vital Signs: Ht 5\' 11"  (1.803 m)   Wt 192 lb (87.1 kg)   BMI 26.78 kg/m   Specialty Comments:  No specialty comments available.  PMFS History: There are no active problems to display for this patient.  History reviewed. No pertinent past medical history.  History reviewed. No pertinent family history.  Past Surgical History:  Procedure Laterality Date  . TONSILLECTOMY     Social History   Occupational History  . Not on file  Tobacco Use  . Smoking status: Never Smoker  . Smokeless tobacco: Never Used  Substance and Sexual Activity  . Alcohol use: No  . Drug use: No  . Sexual activity: Yes    Birth control/protection: Condom

## 2017-10-10 ENCOUNTER — Ambulatory Visit (INDEPENDENT_AMBULATORY_CARE_PROVIDER_SITE_OTHER): Payer: Self-pay | Admitting: Orthopedic Surgery

## 2018-04-27 ENCOUNTER — Encounter (HOSPITAL_COMMUNITY): Payer: Self-pay | Admitting: Emergency Medicine

## 2018-04-27 ENCOUNTER — Ambulatory Visit (HOSPITAL_COMMUNITY)
Admission: EM | Admit: 2018-04-27 | Discharge: 2018-04-27 | Disposition: A | Discharge status: Home / Self Care | Payer: Self-pay | Attending: Family Medicine | Admitting: Family Medicine

## 2018-04-27 ENCOUNTER — Telehealth (HOSPITAL_COMMUNITY): Payer: Self-pay | Admitting: Family Medicine

## 2018-04-27 DIAGNOSIS — L237 Allergic contact dermatitis due to plants, except food: Secondary | ICD-10-CM

## 2018-04-27 MED ORDER — PREDNISONE 10 MG (21) PO TBPK: ORAL_TABLET | ORAL | 0 refills | Status: DC

## 2018-04-27 MED ORDER — METHYLPREDNISOLONE SODIUM SUCC 125 MG IJ SOLR
INTRAMUSCULAR | Status: AC
  Filled 2018-04-27: qty 2

## 2018-04-27 MED ORDER — METHYLPREDNISOLONE SODIUM SUCC 125 MG IJ SOLR
80.0000 mg | Freq: Once | INTRAMUSCULAR | Status: AC
  Administered 2018-04-27: 80 mg via INTRAMUSCULAR

## 2018-04-27 NOTE — ED Triage Notes (Signed)
Pt states he was touching his dog and now thinks he has poison ivy x2 days. Pt states he usually needs a steroid.

## 2018-04-27 NOTE — ED Provider Notes (Signed)
[facility]    CSN: [id number] Arrival date & time: 04/27/18  1050     History   Chief Complaint Chief Complaint  Patient presents with  . Poison Ivy    HPI Gregory [name] is a 27 y.o. male.   Is a healthy 27 year old male that presents for reaction to poison ivy.  This started 2 days ago when he came in contact with the plant while cleaning up an area outside.  He has had the same kind of reaction before.  He has rash to bilateral legs, is, chest, back and neck.  The rash is pruritic  and spreading.  He reports history of steroid shot that helped with last breakout.  He has been using Benadryl for his symptoms.  Denies any fever, joint pain. Denies any recent changes in lotions, detergents, foods or other possible irritants. No recent travel.   He does not smoke    ROS per HPI      History reviewed. No pertinent past medical history.  There are no active problems to display for this patient.   Past Surgical History:  Procedure Laterality Date  . TONSILLECTOMY         Home Medications    Prior to Admission medications   Medication Sig Start Date End Date Taking? Authorizing Provider  albuterol (PROVENTIL HFA;VENTOLIN HFA) 108 (90 BASE) MCG/ACT inhaler Inhale 1-2 puffs into the lungs every 6 (six) hours as needed for wheezing or shortness of breath. 10/02/13   Hayden Rasmussen, NP  Chlorphen-Pseudoephed-APAP (THERAFLU FLU/COLD PO) Take by mouth.    [provider]  fluticasone (CUTIVATE) 0.05 % cream Apply topically 2 (two) times daily. 01/27/13   Linna Hoff, MD  guaiFENesin-codeine (CHERATUSSIN AC) 100-10 MG/5ML syrup Take 5 mLs by mouth every 4 (four) hours as needed for cough or congestion. 10/02/13   Hayden Rasmussen, NP  HYDROcodone-acetaminophen (NORCO/VICODIN) 5-325 MG tablet Take 1-2 tablets by mouth every 4 (four) hours as needed for severe pain. 09/18/17   Joy, Shawn C, PA-C  HYDROcodone-acetaminophen (NORCO/VICODIN) 5-325 MG tablet Take 1  tablet by mouth every 4 (four) hours as needed for moderate pain. 09/22/17   Nadara Mustard, MD  ibuprofen (ADVIL,MOTRIN) 600 MG tablet Take 1 tablet (600 mg total) by mouth every 6 (six) hours as needed. 09/18/17   Joy, Shawn C, PA-C  predniSONE (STERAPRED UNI-PAK 21 TAB) 10 MG (21) TBPK tablet 6 tabs for 1 day, then 5 tabs for 1 das, then 4 tabs for 1 day, then 3 tabs for 1 day, 2 tabs for 1 day, then 1 tab for 1 day 04/27/18   Dahlia Byes A, NP  trimethoprim-polymyxin b (POLYTRIM) ophthalmic solution Place 1 drop into the left eye 4 (four) times daily. For 5 days 11/01/15   Charm Rings, MD    Family History No family history on file.  Social History Social History   Tobacco Use  . Smoking status: Never Smoker  . Smokeless tobacco: Never Used  Substance Use Topics  . Alcohol use: No  . Drug use: No     Allergies   Patient has no known allergies.   Review of Systems Review of Systems   Physical Exam Triage Vital Signs ED Triage Vitals [04/27/18 1150]  Enc Vitals Group     BP 126/67     Pulse Rate 77     Resp 16     Temp (!) 97.3 F (36.3 C)     Temp src  SpO2 100 %     Weight      Height      Head Circumference      Peak Flow      Pain Score      Pain Loc      Pain Edu?      Excl. in GC?    No data found.  Updated Vital Signs BP 126/67   Pulse 77   Temp (!) 97.3 F (36.3 C)   Resp 16   SpO2 100%   Visual Acuity Right Eye Distance:   Left Eye Distance:   Bilateral Distance:    Right Eye Near:   Left Eye Near:    Bilateral Near:     Physical Exam  Constitutional: He is oriented to person, place, and time. He appears well-developed and well-nourished.  Pleasant.  In no distress.  Nontoxic or ill-appearing  HENT:  Head: Normocephalic and atraumatic.  Nose: Nose normal.  Eyes: Pupils are equal, round, and reactive to light. Conjunctivae are normal.  Neck: Normal range of motion.  Pulmonary/Chest: Effort normal.  Musculoskeletal: Normal range  of motion.  Neurological: He is alert and oriented to person, place, and time.  Skin: Skin is warm and dry.  Diffuse papulovesicular rash to bilateral legs, arms, chest and back.   Psychiatric: He has a normal mood and affect.  Nursing note and vitals reviewed.    UC Treatments / Results  Labs (all labs ordered are listed, but only abnormal results are displayed) Labs Reviewed - No data to display  EKG None  Radiology No results found.  Procedures Procedures (including critical care time)  Medications Ordered in UC Medications  methylPREDNISolone sodium succinate (SOLU-MEDROL) 125 mg/2 mL injection 80 mg (80 mg Intramuscular Given 04/27/18 1227)    Initial Impression / Assessment and Plan / UC Course  I have reviewed the triage vital signs and the nursing notes.  Pertinent labs & imaging results that were available during my care of the patient were reviewed by me and considered in my medical decision making (see chart for details).     Poison ivy dermatitis. Steroid injection given in clinic Prednisone taper prescribed Zyrtec or Benadryl for itching Follow up as needed for continued or worsening symptoms  Final Clinical Impressions(s) / UC Diagnoses   Final diagnoses:  Poison ivy dermatitis     Discharge Instructions     It was nice meeting you!!  Steroid injection given here in clinic for poison ivy We will send you home with steroid taper. He can also take Zyrtec or Benadryl as needed.     ED Prescriptions    Medication Sig Dispense Auth. Provider   predniSONE (STERAPRED UNI-PAK 21 TAB) 10 MG (21) TBPK tablet 6 tabs for 1 day, then 5 tabs for 1 das, then 4 tabs for 1 day, then 3 tabs for 1 day, 2 tabs for 1 day, then 1 tab for 1 day 21 tablet Jaci LazierBast, Ericberto Padget A, NP     Controlled Substance Prescriptions [place] Controlled Substance Registry consulted? Not Applicable   Janace ArisBast, Shlomie Romig A, NP 04/27/18 1349

## 2018-04-27 NOTE — Discharge Instructions (Signed)
It was nice meeting you!!  Steroid injection given here in clinic for poison ivy We will send you home with steroid taper. He can also take Zyrtec or Benadryl as needed.

## 2018-12-04 IMAGING — CR DG ANKLE COMPLETE 3+V*R*
3 series · 3 of 3 positions shown · non-contrast
Comparison: None.

CLINICAL DATA: Heel pain.  No trauma.

EXAM:
RIGHT ANKLE - COMPLETE 3+ VIEW

[ankle ap]
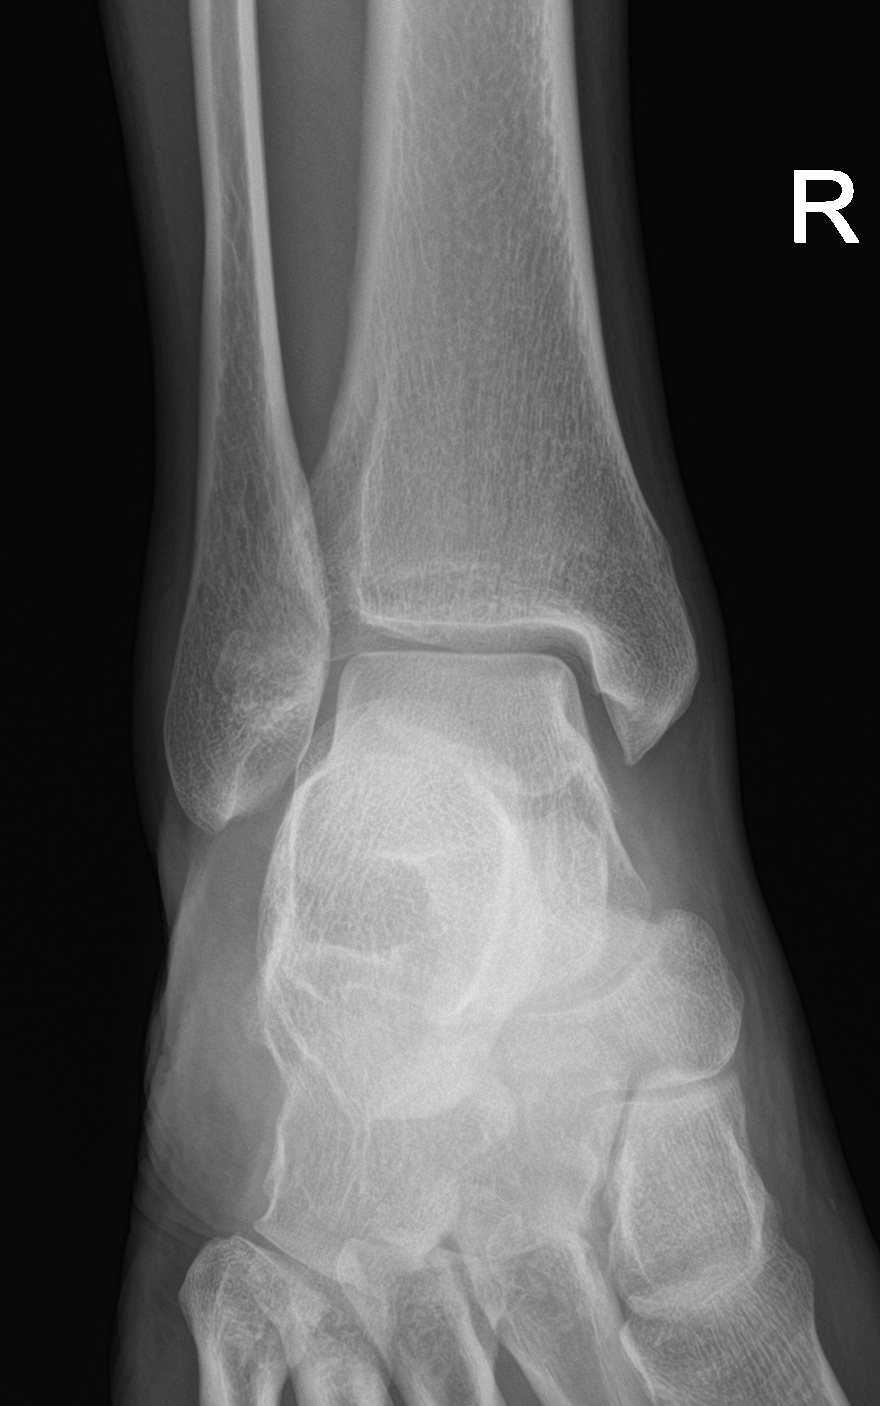

[ankle obl]
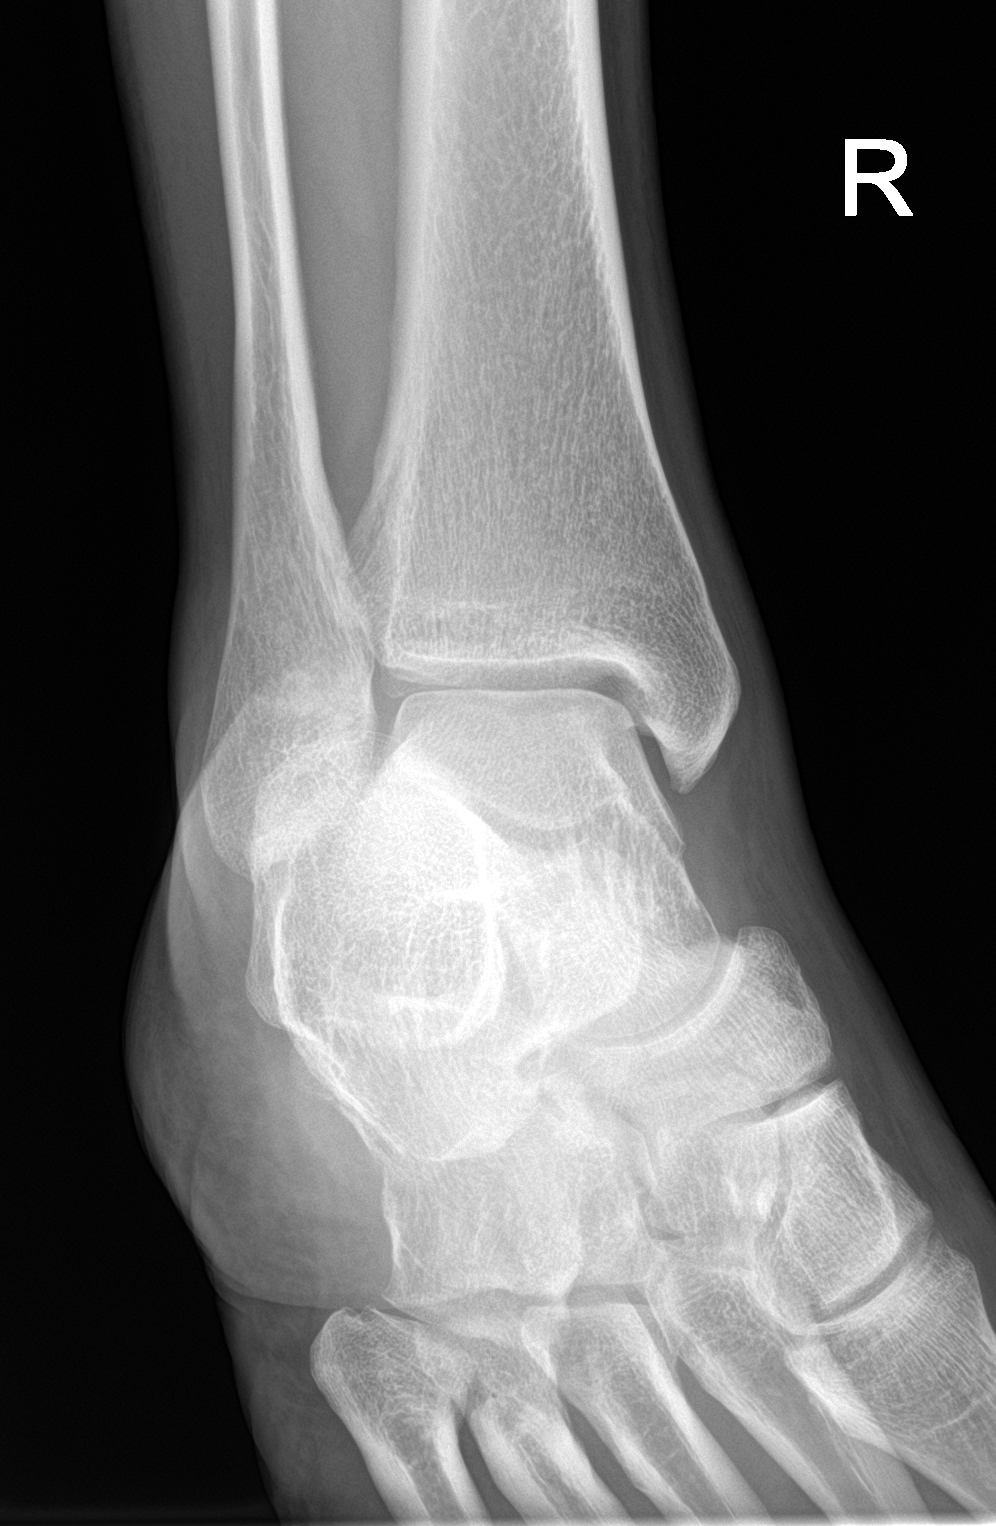

[ankle lat]
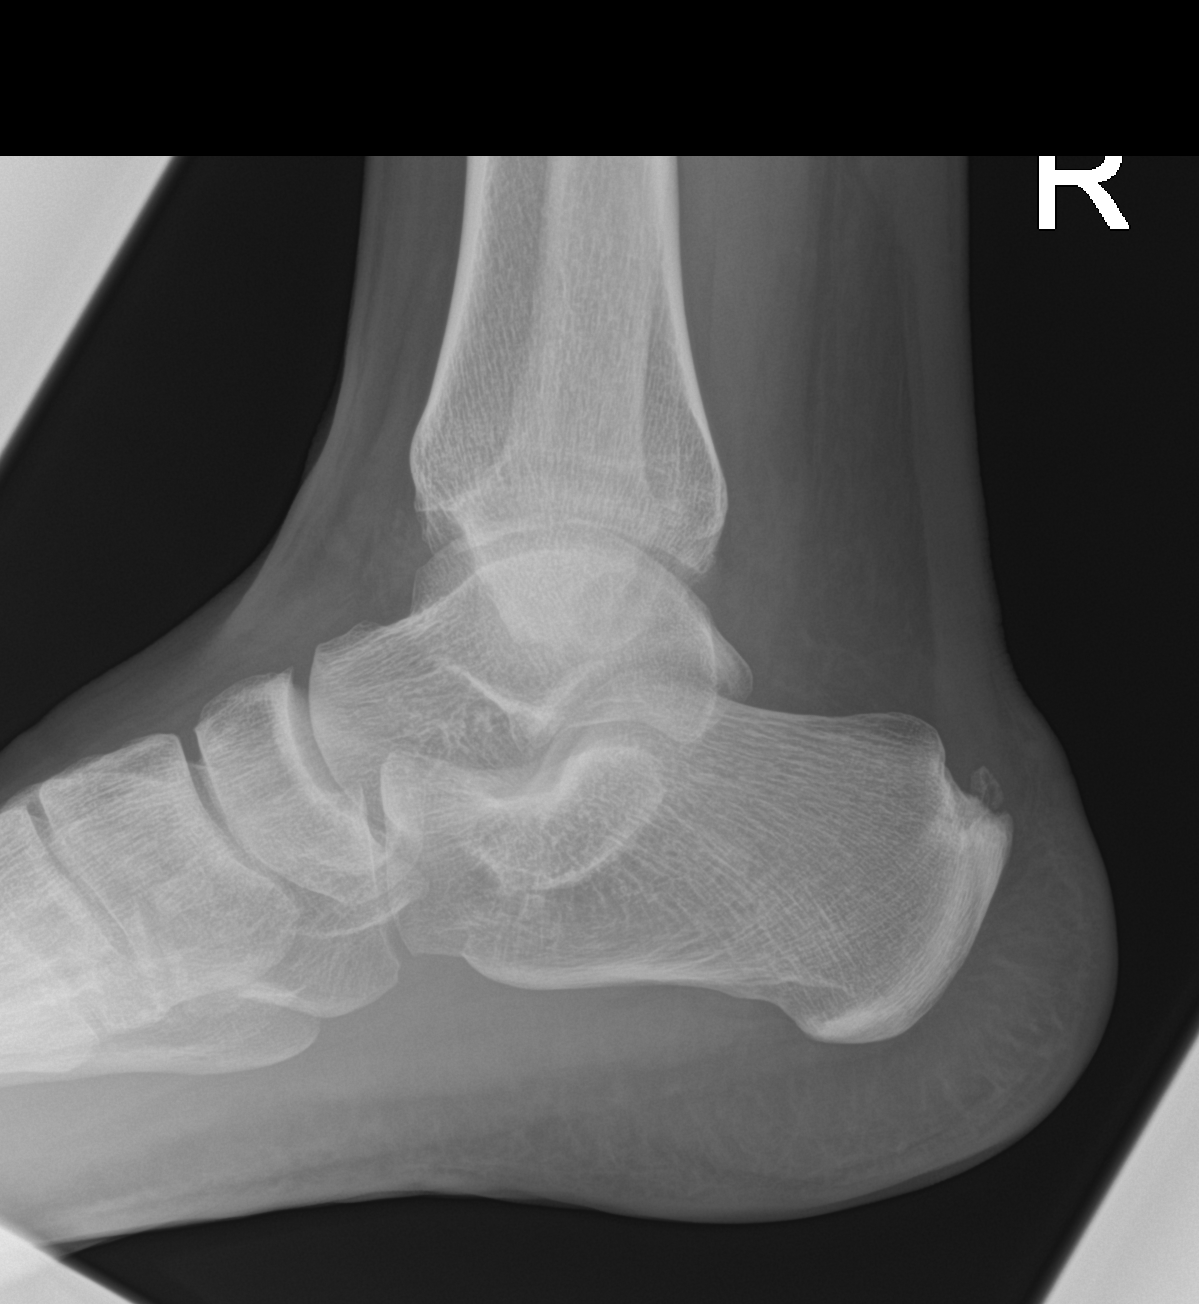

[3 of 3 positions shown; findings below may reference images not displayed]

FINDINGS: No fracture or dislocation. The ankle mortise is intact. The
calcaneus demonstrates a normal shape with no evidence of fracture.
An enthesophyte is seen at the Achilles insertion site posteriorly.
There may be mild soft tissue swelling over this region based on the
lateral view. Recommend clinical correlation. No other acute
abnormalities.
IMPRESSION: An irregular enthesophyte is seen at the posterosuperior aspect of
the calcaneus. There may be some mild overlying soft tissue
swelling. Recommend clinical correlation. No other abnormality.

## 2022-03-21 ENCOUNTER — Emergency Department (HOSPITAL_COMMUNITY): Payer: Self-pay

## 2022-03-21 ENCOUNTER — Emergency Department (HOSPITAL_COMMUNITY)
Admission: EM | Admit: 2022-03-21 | Discharge: 2022-03-21 | Disposition: A | Discharge status: Home / Self Care | Payer: Self-pay | Attending: Emergency Medicine | Admitting: Emergency Medicine

## 2022-03-21 ENCOUNTER — Encounter (HOSPITAL_COMMUNITY): Payer: Self-pay

## 2022-03-21 DIAGNOSIS — S060X1A Concussion with loss of consciousness of 30 minutes or less, initial encounter: Secondary | ICD-10-CM | POA: Insufficient documentation

## 2022-03-21 DIAGNOSIS — Y92410 Unspecified street and highway as the place of occurrence of the external cause: Secondary | ICD-10-CM | POA: Insufficient documentation

## 2022-03-21 DIAGNOSIS — M79642 Pain in left hand: Secondary | ICD-10-CM | POA: Insufficient documentation

## 2022-03-21 MED ORDER — MECLIZINE HCL 25 MG PO TABS: 25.0000 mg | ORAL_TABLET | Freq: Three times a day (TID) | ORAL | 0 refills | Status: DC | PRN

## 2022-03-21 MED ORDER — TRAMADOL HCL 50 MG PO TABS: 50.0000 mg | ORAL_TABLET | Freq: Four times a day (QID) | ORAL | 0 refills | Status: DC | PRN

## 2022-03-21 MED ORDER — ONDANSETRON 4 MG PO TBDP
4.0000 mg | ORAL_TABLET | Freq: Once | ORAL | Status: AC
  Administered 2022-03-21: 4 mg via ORAL
  Filled 2022-03-21: qty 1

## 2022-03-21 MED ORDER — OXYCODONE-ACETAMINOPHEN 5-325 MG PO TABS
1.0000 | ORAL_TABLET | Freq: Once | ORAL | Status: AC
  Administered 2022-03-21: 1 via ORAL
  Filled 2022-03-21: qty 1
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
## 2022-03-21 NOTE — Discharge Instructions (Signed)
Get help right away if: You have new or worsening physical symptoms, such as: A severe or worsening headache. Weakness or numbness in any part of your body, slurred speech, vision changes, or confusion. Your coordination gets worse. Vomiting repeatedly. You have a seizure. You have unusual behavior changes. You lose consciousness, are sleepier than normal, or are difficult to wake up.

## 2022-11-11 ENCOUNTER — Emergency Department (HOSPITAL_COMMUNITY): Payer: Self-pay

## 2022-11-11 ENCOUNTER — Encounter (HOSPITAL_COMMUNITY): Payer: Self-pay

## 2022-11-11 ENCOUNTER — Emergency Department (HOSPITAL_COMMUNITY)
Admission: EM | Admit: 2022-11-11 | Discharge: 2022-11-11 | Disposition: A | Discharge status: Home / Self Care | Payer: Self-pay | Attending: Emergency Medicine | Admitting: Emergency Medicine

## 2022-11-11 ENCOUNTER — Other Ambulatory Visit: Payer: Self-pay

## 2022-11-11 DIAGNOSIS — Y92410 Unspecified street and highway as the place of occurrence of the external cause: Secondary | ICD-10-CM | POA: Insufficient documentation

## 2022-11-11 DIAGNOSIS — S0292XA Unspecified fracture of facial bones, initial encounter for closed fracture: Secondary | ICD-10-CM

## 2022-11-11 DIAGNOSIS — S199XXA Unspecified injury of neck, initial encounter: Secondary | ICD-10-CM | POA: Diagnosis present

## 2022-11-11 DIAGNOSIS — S161XXA Strain of muscle, fascia and tendon at neck level, initial encounter: Secondary | ICD-10-CM | POA: Diagnosis not present

## 2022-11-11 LAB — CBC
HCT: 43.5 % (ref 39.0–52.0)
Hemoglobin: 14.8 g/dL (ref 13.0–17.0)
MCH: 29.4 pg (ref 26.0–34.0)
MCHC: 34 g/dL (ref 30.0–36.0)
MCV: 86.3 fL (ref 80.0–100.0)
Platelets: 263 10*3/uL (ref 150–400)
RBC: 5.04 MIL/uL (ref 4.22–5.81)
RDW: 13.2 % (ref 11.5–15.5)
WBC: 7 10*3/uL (ref 4.0–10.5)
nRBC: 0 % (ref 0.0–0.2)

## 2022-11-11 LAB — I-STAT CHEM 8, ED
BUN: 13 mg/dL (ref 6–20)
Calcium, Ion: 1.25 mmol/L (ref 1.15–1.40)
Chloride: 104 mmol/L (ref 98–111)
Creatinine, Ser: 0.7 mg/dL (ref 0.61–1.24)
Glucose, Bld: 93 mg/dL (ref 70–99)
HCT: 43 % (ref 39.0–52.0)
Hemoglobin: 14.6 g/dL (ref 13.0–17.0)
Potassium: 4.5 mmol/L (ref 3.5–5.1)
Sodium: 141 mmol/L (ref 135–145)
TCO2: 28 mmol/L (ref 22–32)

## 2022-11-11 LAB — COMPREHENSIVE METABOLIC PANEL
ALT: 17 U/L (ref 0–44)
AST: 18 U/L (ref 15–41)
Albumin: 4.4 g/dL (ref 3.5–5.0)
Alkaline Phosphatase: 56 U/L (ref 38–126)
Anion gap: 7 (ref 5–15)
BUN: 12 mg/dL (ref 6–20)
CO2: 27 mmol/L (ref 22–32)
Calcium: 9.5 mg/dL (ref 8.9–10.3)
Chloride: 106 mmol/L (ref 98–111)
Creatinine, Ser: 0.77 mg/dL (ref 0.61–1.24)
GFR, Estimated: >60 mL/min (ref >60)
Glucose, Bld: 96 mg/dL (ref 70–99)
Potassium: 4.5 mmol/L (ref 3.5–5.1)
Sodium: 140 mmol/L (ref 135–145)
Total Bilirubin: 0.8 mg/dL (ref 0.3–1.2)
Total Protein: 7 g/dL (ref 6.5–8.1)

## 2022-11-11 MED ORDER — IOHEXOL 350 MG/ML SOLN
75.0000 mL | Freq: Once | INTRAVENOUS | Status: AC | PRN
  Administered 2022-11-11: 75 mL via INTRAVENOUS

## 2022-11-11 MED ORDER — ONDANSETRON 4 MG PO TBDP
4.0000 mg | ORAL_TABLET | Freq: Once | ORAL | Status: AC
  Administered 2022-11-11: 4 mg via ORAL
  Filled 2022-11-11: qty 1

## 2022-11-11 MED ORDER — OXYCODONE-ACETAMINOPHEN 5-325 MG PO TABS
2.0000 | ORAL_TABLET | Freq: Once | ORAL | Status: AC
  Administered 2022-11-11: 2 via ORAL
  Filled 2022-11-11: qty 2

## 2022-11-11 MED ORDER — HYDROMORPHONE HCL 1 MG/ML IJ SOLN
0.5000 mg | Freq: Once | INTRAMUSCULAR | Status: AC
  Administered 2022-11-11: 0.5 mg via INTRAVENOUS
  Filled 2022-11-11: qty 1

## 2022-11-11 MED ORDER — OXYCODONE-ACETAMINOPHEN 5-325 MG PO TABS: 2.0000 | ORAL_TABLET | Freq: Once | ORAL | Status: DC

## 2022-11-11 MED ORDER — OXYCODONE-ACETAMINOPHEN 5-325 MG PO TABS: 1.0000 | ORAL_TABLET | Freq: Three times a day (TID) | ORAL | 0 refills | Status: AC | PRN

## 2022-11-11 NOTE — ED Notes (Signed)
Patient transported to MRI 
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
## 2022-11-11 NOTE — ED Notes (Signed)
Patient verbalizes understanding of discharge instructions. Opportunity for questioning and answers were provided. Pt discharged from ED. 
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
## 2022-11-11 NOTE — ED Notes (Signed)
Pt returned from MRI °

## 2024-09-06 ENCOUNTER — Ambulatory Visit (HOSPITAL_COMMUNITY)
Admission: EM | Admit: 2024-09-06 | Discharge: 2024-09-06 | Disposition: A | Discharge status: Home / Self Care | Payer: Self-pay | Attending: Family Medicine | Admitting: Family Medicine

## 2024-09-06 NOTE — ED Notes (Signed)
 2nd attempt called patient from the lobby. No answer.

## 2024-09-06 NOTE — ED Notes (Signed)
 First attempt to call patient in lobby. No response
